# Patient Record
Sex: Female | Born: 2005 | Race: White | Hispanic: No | Marital: Single
Health system: Southern US, Community
[De-identification: ages and names within clinical notes are randomized; demographics above are authoritative.]

---
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
## 2024-09-29 ENCOUNTER — Other Ambulatory Visit: Payer: Self-pay

## 2024-09-29 ENCOUNTER — Emergency Department: Admission: EM | Admit: 2024-09-29 | Discharge: 2024-09-29 | Disposition: A | Discharge status: Home / Self Care

## 2024-09-29 DIAGNOSIS — R059 Cough, unspecified: Secondary | ICD-10-CM | POA: Diagnosis present

## 2024-09-29 DIAGNOSIS — J101 Influenza due to other identified influenza virus with other respiratory manifestations: Secondary | ICD-10-CM | POA: Insufficient documentation

## 2024-09-29 LAB — GROUP A STREP BY PCR: Group A Strep by PCR: NOT DETECTED

## 2024-09-29 LAB — RESP PANEL BY RT-PCR (RSV, FLU A&B, COVID)  RVPGX2
Influenza A by PCR: POSITIVE — AB
Influenza B by PCR: NEGATIVE
Resp Syncytial Virus by PCR: NEGATIVE
SARS Coronavirus 2 by RT PCR: NEGATIVE

## 2024-09-29 MED ORDER — ONDANSETRON 4 MG PO TBDP: 4.0000 mg | ORAL_TABLET | Freq: Three times a day (TID) | ORAL | 0 refills | Status: AC | PRN

## 2024-09-29 NOTE — Discharge Instructions (Signed)
 You tested positive for the flu today.  This is a viral illness which will resolve on its own with time.  You do not need an antibiotic.  You can take over-the-counter cold medicine as needed to manage your symptoms.  If you are taking combination cold medicine keep in mind that this often contains Tylenol so if you need additional medication for body aches or fever control please take Motrin or ibuprofen.  Your symptoms should resolve with time, if you have had symptoms for greater than 10 days please be evaluated by another healthcare provider as at this point it may have developed into a bacterial infection which requires a different treatment.  Return to the emergency department with worsening symptoms.

## 2024-09-29 NOTE — ED Provider Notes (Signed)
 "  [facility] Provider Note    Event Date/Time   First MD Initiated Contact with Patient 09/29/24 1030     (approximate)   History   Sore Throat   HPI  April Cunningham is a 18 y.o. female with no PMH presents for evaluation of sore throat, cough, congestion and fever that began yesterday.  Patient reports pain when coughing and talking and swallowing.  She does have some right sided ear pain as well.  No chest pain or difficulty breathing.      Physical Exam   Triage Vital Signs: ED Triage Vitals [09/29/24 1021]  Encounter Vitals Group     BP 110/85     Girls Systolic BP Percentile      Girls Diastolic BP Percentile      Boys Systolic BP Percentile      Boys Diastolic BP Percentile      Pulse Rate (!) 134     Resp 20     Temp 99.7 F (37.6 C)     Temp Source Oral     SpO2 96 %     Weight 130 lb (59 kg)     Height 5' 4 (1.626 m)     Head Circumference      Peak Flow      Pain Score 8     Pain Loc      Pain Education      Exclude from Growth Chart     Most recent vital signs: Vitals:   09/29/24 1021 09/29/24 1308  BP: 110/85   Pulse: (!) 134 (!) 112  Resp: 20   Temp: 99.7 F (37.6 C) 98.7 F (37.1 C)  SpO2: 96%    General: Awake, no distress.  CV:  Good peripheral perfusion.  RRR. Resp:  Normal effort.  CTAB Abd:  No distention.  Other:  Oral mucous membranes are moist, tonsils are enlarged with white exudate, right TM is erythematous but not bulging, left TM is translucent   ED Results / Procedures / Treatments   Labs (all labs ordered are listed, but only abnormal results are displayed) Labs Reviewed  RESP PANEL BY RT-PCR (RSV, FLU A&B, COVID)  RVPGX2 - Abnormal; Notable for the following components:      Result Value   Influenza A by PCR POSITIVE (*)    All other components within normal limits  GROUP A STREP BY PCR    PROCEDURES:  Critical Care performed: No  Procedures   MEDICATIONS ORDERED IN  ED: Medications - No data to display   IMPRESSION / MDM / ASSESSMENT AND PLAN / ED COURSE  I reviewed the triage vital signs and the nursing notes.                             18 year old female presents for evaluation of sore throat, cough, congestion and fever.  Was tachycardic on presentation.  Vital signs stable otherwise.  Differential diagnosis includes, but is not limited to, flu, COVID, RSV, strep pharyngitis, bronchitis, pneumonia.  Patient's presentation is most consistent with acute complicated illness / injury requiring diagnostic workup.  Respiratory panel positive for influenza A.  Strep test is negative.  Patient's tachycardia improved from initial assessment but heart rate is still elevated.  I do believe this is due to the flu.  Encouraged patient to drink lots of water and rest.  She was advised on symptomatic management using cold medicine.  She was given a note for work.  She voiced understanding, all questions were answered and she stable at discharge.     FINAL CLINICAL IMPRESSION(S) / ED DIAGNOSES   Final diagnoses:  Influenza A     Rx / DC Orders   ED Discharge Orders          Ordered    ondansetron  (ZOFRAN -ODT) 4 MG disintegrating tablet  Every 8 hours PRN        09/29/24 1309             Note:  This document was prepared using Dragon voice recognition software and may include unintentional dictation errors.   Cleaster Tinnie LABOR, PA-C 09/29/24 1310    Jossie Artist POUR, MD 09/29/24 1853  "

## 2024-09-29 NOTE — ED Triage Notes (Signed)
 Pt c/o sore throat since yesterday. States it hurts to cough & talk.
